# Patient Record
Sex: Male | Born: 1990 | Race: White | Hispanic: No | Marital: Single | Smoking: Former smoker
Health system: Southern US, Community
[De-identification: ages and names within clinical notes are randomized; demographics above are authoritative.]

---
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
## 2010-01-08 ENCOUNTER — Ambulatory Visit (HOSPITAL_COMMUNITY): Admission: RE | Admit: 2010-01-08 | Discharge: 2010-01-08 | Payer: Self-pay | Admitting: Preventative Medicine

## 2010-06-01 LAB — BASIC METABOLIC PANEL
BUN: 12 mg/dL (ref 6–23)
CO2: 30 mEq/L (ref 19–32)
Calcium: 9.4 mg/dL (ref 8.4–10.5)
Chloride: 105 mEq/L (ref 96–112)
Creatinine, Ser: 1.32 mg/dL (ref 0.4–1.5)
GFR calc Af Amer: >60 mL/min (ref >60)
GFR calc non Af Amer: >60 mL/min (ref >60)
Glucose, Bld: 90 mg/dL (ref 70–99)
Potassium: 3.7 mEq/L (ref 3.5–5.1)
Sodium: 138 mEq/L (ref 135–145)

## 2011-05-03 ENCOUNTER — Emergency Department (HOSPITAL_COMMUNITY)
Admission: EM | Admit: 2011-05-03 | Discharge: 2011-05-03 | Disposition: A | Discharge status: Home Health | Payer: Self-pay | Attending: Emergency Medicine | Admitting: Emergency Medicine

## 2011-05-03 ENCOUNTER — Emergency Department (HOSPITAL_COMMUNITY): Payer: Self-pay

## 2011-05-03 ENCOUNTER — Encounter (HOSPITAL_COMMUNITY): Payer: Self-pay | Admitting: Emergency Medicine

## 2011-05-03 DIAGNOSIS — Z79899 Other long term (current) drug therapy: Secondary | ICD-10-CM | POA: Insufficient documentation

## 2011-05-03 DIAGNOSIS — R209 Unspecified disturbances of skin sensation: Secondary | ICD-10-CM | POA: Insufficient documentation

## 2011-05-03 DIAGNOSIS — M79609 Pain in unspecified limb: Secondary | ICD-10-CM | POA: Insufficient documentation

## 2011-05-03 DIAGNOSIS — M25579 Pain in unspecified ankle and joints of unspecified foot: Secondary | ICD-10-CM | POA: Insufficient documentation

## 2011-05-03 DIAGNOSIS — M7989 Other specified soft tissue disorders: Secondary | ICD-10-CM | POA: Insufficient documentation

## 2011-05-03 DIAGNOSIS — M25473 Effusion, unspecified ankle: Secondary | ICD-10-CM | POA: Insufficient documentation

## 2011-05-03 DIAGNOSIS — W11XXXA Fall on and from ladder, initial encounter: Secondary | ICD-10-CM | POA: Insufficient documentation

## 2011-05-03 DIAGNOSIS — R269 Unspecified abnormalities of gait and mobility: Secondary | ICD-10-CM | POA: Insufficient documentation

## 2011-05-03 DIAGNOSIS — S93609A Unspecified sprain of unspecified foot, initial encounter: Secondary | ICD-10-CM | POA: Insufficient documentation

## 2011-05-03 DIAGNOSIS — M25476 Effusion, unspecified foot: Secondary | ICD-10-CM | POA: Insufficient documentation

## 2011-05-03 MED ORDER — HYDROMORPHONE HCL PF 1 MG/ML IJ SOLN
1.0000 mg | Freq: Once | INTRAMUSCULAR | Status: AC
  Administered 2011-05-03: 1 mg via INTRAMUSCULAR
  Filled 2011-05-03: qty 1

## 2011-05-03 MED ORDER — HYDROMORPHONE HCL PF 1 MG/ML IJ SOLN
1.0000 mg | INTRAMUSCULAR | Status: AC
  Administered 2011-05-03: 1 mg via INTRAMUSCULAR
  Filled 2011-05-03: qty 1

## 2011-05-03 MED ORDER — NAPROXEN 500 MG PO TABS: 500.0000 mg | ORAL_TABLET | Freq: Two times a day (BID) | ORAL | Status: AC

## 2011-05-03 MED ORDER — HYDROCODONE-ACETAMINOPHEN 5-500 MG PO TABS: 1.0000 | ORAL_TABLET | Freq: Four times a day (QID) | ORAL | Status: AC | PRN

## 2011-05-03 NOTE — Discharge Instructions (Signed)
Foot sprain  Your caregiver has diagnosed you as suffering from an foot sprain. Ankle and foot sprain occurs when the ligaments that hold the ankle joint to get her are stretched or torn. It may take 4-6 weeks to heal.  For activity: Use crutches with nonweightbearing for the first few days. Then, you may walk on your ankles as the pain allows, or as instructed. Start gradually with weight bearing on the affected ankle. Once you can walk pain free, then try jogging. When you can run forwards, then you can try moving side to side. If you cannot walk without crutches in one week, you need a recheck by your Family Doctor.  Seek immediate medical attention if: You're toes are numb or tingling, appear gray or blue, or you have severe pain (also elevate the leg and loosen the splint if this occurs)  If you do not have a family doctor to followup with, you can see the list of phone numbers below. Please call today to make a followup appointment.   RICE therapy:  Routine Care for injuries  Rest, Ice, Compression, Elevation (RICE)  Rest is needed to allow your body to heal. Routine activities can be resumed when comfortable. Injury tendons and bones can take up to 6 weeks to heal. Tendons are cordlike structures that attach muscles and bones.  Ice following an injury helps keep the swelling down and reduce the pain. Put ice in a plastic bag. Place a towel between your skin and the bag of ice. Leave the ice on for 15-20 minutes, 3-4 times a day. Do this while awake, for the first 24-48 hours. After that continue as directed by your caregiver.  Compression helps keep swelling down. It also gives support and helps with discomfort. If any lasting bandage has been applied, it should be removed and reapplied every 3-4 hours. It should not be applied tightly, but firmly enough to keep swelling down. Watch fingers or toes for swelling, discoloration, coldness, numbness or excessive pain. If any of these problems  occur, removed the bandage and reapply loosely. Contact your caregiver if these problems continue.  Elevation helps reduce swelling and decrease your pain. With extremities such as the arms, hands, legs and feet, the injured area should be placed near or above the level of the heart if possible.  Seek immediate medical care if you have persistent pain and swelling, developed redness numbness or unexpected weakness, your symptoms are getting worse rather than improving after several days. The symptoms may indicate that further evaluation or further x-rays are needed. Sometimes, x-rays may not show a small broken bone until one week or 10 days later. Make a followup appointment with your caregiver. Ask when your x-ray results will be ready. Make sure you get your x-ray results  RESOURCE GUIDE  Dental Problems  Patients with Medicaid: [facility] [phone]                                           [phone] [address] Phone:  [phone]  Phone:  510-2600 ° °If unable to pay or uninsured, contact:  [facility] or [facility] to become qualified for the adult dental clinic. ° °Chronic Pain Problems °Contact [facility]  297-2271 °Patients need to be referred by their primary care doctor. ° °Insufficient Money for Medicine °Contact [employer]:  call "211" or [facility] 271-5999. ° °No Primary Care Doctor °Call [facility]  832-8000 °Other agencies that provide inexpensive medical care °   [facility]  832-8035 °    Internal Medicine  832-7272 °   [facility]  271-5999 °   Women's Clinic  832-4777 °   [facility]  373-0678 °   [facility]  272-1050 ° °Psychological Services °[facility]  832-9600 °[facility]  378-7881 °[facility]   800 853-5163 (emergency services  641-4993) ° °Substance Abuse Resources °[facility]  336-882-2125 °[facility] 336-784-9470 °[facility] 336-285-9073 °[facility] 336-845-3988 °Residential & Outpatient Substance Abuse Program  800-659-3381 ° °Abuse/Neglect °[place] [facility] (336) 641-3795 °[place] [facility] 800-378-5315 (After Hours) ° °Emergency Shelter °[facility] (336) 271-5985 ° °Maternity Homes °[facility] (336) 275-9566 °[facility] (704) 372-4663 ° °MRSA Hotline #:   832-7006 ° ° ° °[place] [facility] ° °[facility] [facility]     [employer]                          [facility] °[address] [place]                       [address]      [address]  °[place]                                                [place]                            [place] °Phone:  349-3220                                   Phone:  342-7768                 Phone:  342-8140 ° °[facility] °Phone:  342-8316 ° °[place] [facility] Hotline °(336) 342-1394 °(336) 342-3537 (After Hours) ° ° ° ° °

## 2011-05-03 NOTE — ED Notes (Signed)
Patient returned from CT scan via wheelchair

## 2011-05-03 NOTE — ED Notes (Signed)
Hydromorphone 1mg  IM left deltoid - in CT at this time.  Will scan/document on MAR when patient returns.(No scanner available in CT room.)

## 2011-05-03 NOTE — ED Provider Notes (Signed)
History     CSN: [id number]  Arrival date & time 05/03/11  0112   First MD Initiated Contact with Patient 05/03/11 0145      Chief Complaint  Patient presents with  . Ankle Pain    (Consider location/radiation/quality/duration/timing/severity/associated sxs/prior treatment) HPI Comments: Chief Complaint: Extremity injury  Chad Taylor 22 y.o. male   patient and parent  Mechanism of injury fall from tree - 10 feet up in ladder, Location right foot/feet  Onset:  sudden,   Course worsened,  Timing: persistent,   Severity severe  Alleviating factors rest,    Aggravating factors, walking, standing  Treatment prior to arrival none  Ambulation: can't walk since 10 minutes after fall 2/2 pain  no surgeries  Additional History: none  = occurred at 6 PM   Patient is a 21 y.o. male presenting with ankle pain. The history is provided by the patient and a relative.  Ankle Pain  Pertinent negatives include no numbness.    History reviewed. No pertinent past medical history.  History reviewed. No pertinent past surgical history.  No family history on file.  History  Substance Use Topics  . Smoking status: Former Games developer  . Smokeless tobacco: Not on file  . Alcohol Use: Yes      Review of Systems  Constitutional: Negative for fever and chills.  HENT: Negative for neck pain.   Eyes: Negative for visual disturbance.  Respiratory: Negative for shortness of breath.   Cardiovascular: Positive for leg swelling.  Gastrointestinal: Negative for nausea and vomiting.  Musculoskeletal: Positive for joint swelling and gait problem. Negative for back pain.  Skin: Negative for rash.  Neurological: Negative for weakness, numbness and headaches.  Hematological: Does not bruise/bleed easily.    Allergies  Review of patient's allergies indicates no known allergies.  Home Medications   Current Outpatient Rx  Name Route Sig Dispense Refill  .  HYDROCODONE-ACETAMINOPHEN 5-500 MG PO TABS Oral Take 1-2 tablets by mouth every 6 (six) hours as needed for pain. 15 tablet 0  . NAPROXEN 500 MG PO TABS Oral Take 1 tablet (500 mg total) by mouth 2 (two) times daily with a meal. 30 tablet 0    BP 134/99  Pulse 94  Temp(Src) 98 F (36.7 C) (Oral)  Resp 20  Ht 6' (1.829 m)  Wt 245 lb (111.131 kg)  BMI 33.23 kg/m2  SpO2 97%  Physical Exam  Nursing note and vitals reviewed. Constitutional: He appears well-developed and well-nourished. He appears distressed.  HENT:  Head: Normocephalic and atraumatic.  Mouth/Throat: Oropharynx is clear and moist. No oropharyngeal exudate.  Eyes: Conjunctivae and EOM are normal. Pupils are equal, round, and reactive to light. Right eye exhibits no discharge. Left eye exhibits no discharge. No scleral icterus.  Neck: Normal range of motion. Neck supple. No JVD present. No thyromegaly present.  Cardiovascular: Normal rate, regular rhythm, normal heart sounds and intact distal pulses.  Exam reveals no gallop and no friction rub.   No murmur heard. Pulmonary/Chest: Effort normal and breath sounds normal. No respiratory distress. He has no wheezes. He has no rales.  Abdominal: Soft. Bowel sounds are normal. He exhibits no distension and no mass. There is no tenderness.  Musculoskeletal: Normal range of motion. He exhibits tenderness ( R foot with severe ttp on top of foot - no ttp over the [facility].  dec ROM second to pain, moderate swelling to mid foot. - normal pulses). He exhibits no edema.  Lymphadenopathy:    He  has no cervical adenopathy.  Neurological: He is alert. Coordination normal.  Skin: Skin is warm and dry. No rash noted. No erythema.  Psychiatric: He has a normal mood and affect. His behavior is normal.    ED Course  Procedures (including critical care time)  Labs Reviewed - No data to display Dg Ankle Complete Right  05/03/2011  *RADIOLOGY REPORT*  Clinical Data:  Right ankle pain, fell off  ladder hyperflexing and twisting right foot/ankle  RIGHT ANKLE - COMPLETE 3+ VIEW  Comparison: None.  Findings: Ankle mortise intact. Osseous mineralization normal. Minimal scattered soft tissue swelling. No acute fracture, dislocation, or bone destruction.  IMPRESSION: No acute osseous abnormalities.  Original Report Authenticated By: Lollie Marrow, M.D.   Ct Foot Right Wo Contrast  05/03/2011  *RADIOLOGY REPORT*  Clinical Data:  Larey Seat off ladder, pain and swelling, negative radiographs  CT OF THE RIGHT FOOT WITHOUT CONTRAST  Technique:  Multidetector CT imaging was performed according to the standard protocol. Multiplanar CT image reconstructions were also generated.  Comparison: Radiographs 05/03/2011  Findings: Osseous mineralization normal. Joint spaces preserved. No acute fracture or dislocation. No bone destruction. No significant regional soft tissue abnormalities, abnormal fluid collections or hematoma identified.  IMPRESSION: No acute osseous abnormalities.  Original Report Authenticated By: Lollie Marrow, M.D.     1. Sprain of foot       MDM  ttp over mid foot - imaging is neg for frx - I have personally seen xray and see no frx of ankle - proceed with CT of foot - pain meds.  Ice and elevate.  Initial imaging of the foot showed no signs of fracture of the ankle or foot. CT scan obtained due to the significant pain he continues to experience. CT scan was read as negative, no signs of fracture or dislocation. I have placed an Ace wrap, ice pack, 2 doses of intramuscular hydromorphone and was sent home with orthopedic followup when necessary. Crutches given prior to discharge  Discharge Prescriptions include:  #1 Naprosyn,  #2 hydrocodone       Vida Roller, MD 05/03/11 [phone]

## 2011-05-03 NOTE — ED Notes (Signed)
Patient states was cutting trees and a branch was falling toward him; states he jumped off ladder and landed on roots and rolled his right ankle.  States pain woke him from sleep.

## 2011-05-03 NOTE — ED Notes (Signed)
Patient remains in CT scan.  Mother informed that patient remains in CT.

## 2015-10-16 ENCOUNTER — Emergency Department (HOSPITAL_COMMUNITY): Payer: Managed Care, Other (non HMO)

## 2015-10-16 ENCOUNTER — Encounter (HOSPITAL_COMMUNITY): Payer: Self-pay | Admitting: Emergency Medicine

## 2015-10-16 ENCOUNTER — Emergency Department (HOSPITAL_COMMUNITY)
Admission: EM | Admit: 2015-10-16 | Discharge: 2015-10-16 | Disposition: A | Discharge status: Home / Self Care | Payer: Managed Care, Other (non HMO) | Attending: Emergency Medicine | Admitting: Emergency Medicine

## 2015-10-16 DIAGNOSIS — Y92410 Unspecified street and highway as the place of occurrence of the external cause: Secondary | ICD-10-CM | POA: Insufficient documentation

## 2015-10-16 DIAGNOSIS — S60511A Abrasion of right hand, initial encounter: Secondary | ICD-10-CM | POA: Insufficient documentation

## 2015-10-16 DIAGNOSIS — Y999 Unspecified external cause status: Secondary | ICD-10-CM | POA: Diagnosis not present

## 2015-10-16 DIAGNOSIS — M25572 Pain in left ankle and joints of left foot: Secondary | ICD-10-CM | POA: Diagnosis not present

## 2015-10-16 DIAGNOSIS — S60512A Abrasion of left hand, initial encounter: Secondary | ICD-10-CM | POA: Diagnosis not present

## 2015-10-16 DIAGNOSIS — M542 Cervicalgia: Secondary | ICD-10-CM | POA: Diagnosis not present

## 2015-10-16 DIAGNOSIS — Y939 Activity, unspecified: Secondary | ICD-10-CM | POA: Diagnosis not present

## 2015-10-16 DIAGNOSIS — R51 Headache: Secondary | ICD-10-CM | POA: Diagnosis not present

## 2015-10-16 DIAGNOSIS — W19XXXA Unspecified fall, initial encounter: Secondary | ICD-10-CM

## 2015-10-16 DIAGNOSIS — S6991XA Unspecified injury of right wrist, hand and finger(s), initial encounter: Secondary | ICD-10-CM | POA: Diagnosis present

## 2015-10-16 DIAGNOSIS — Z87891 Personal history of nicotine dependence: Secondary | ICD-10-CM | POA: Diagnosis not present

## 2015-10-16 LAB — I-STAT CHEM 8, ED
BUN: 11 mg/dL (ref 6–20)
CREATININE: 1.2 mg/dL (ref 0.61–1.24)
Calcium, Ion: 1.23 mmol/L (ref 1.13–1.30)
Chloride: 103 mmol/L (ref 101–111)
GLUCOSE: 89 mg/dL (ref 65–99)
HEMATOCRIT: 42 % (ref 39.0–52.0)
HEMOGLOBIN: 14.3 g/dL (ref 13.0–17.0)
POTASSIUM: 3.8 mmol/L (ref 3.5–5.1)
Sodium: 144 mmol/L (ref 135–145)
TCO2: 27 mmol/L (ref 0–100)

## 2015-10-16 MED ORDER — HYDROCODONE-ACETAMINOPHEN 5-325 MG PO TABS
1.0000 | ORAL_TABLET | Freq: Once | ORAL | Status: AC
  Administered 2015-10-16: 1 via ORAL
  Filled 2015-10-16: qty 1

## 2015-10-16 MED ORDER — TRAMADOL HCL 50 MG PO TABS: 50.0000 mg | ORAL_TABLET | Freq: Four times a day (QID) | ORAL | 0 refills | Status: DC | PRN

## 2015-10-16 MED ORDER — IOPAMIDOL (ISOVUE-300) INJECTION 61%
100.0000 mL | Freq: Once | INTRAVENOUS | Status: AC | PRN
  Administered 2015-10-16: 100 mL via INTRAVENOUS

## 2015-10-16 MED ORDER — SODIUM CHLORIDE 0.9 % IV BOLUS (SEPSIS)
500.0000 mL | Freq: Once | INTRAVENOUS | Status: AC
  Administered 2015-10-16: 500 mL via INTRAVENOUS

## 2015-10-16 NOTE — Discharge Instructions (Signed)
[place] he can follow-up with Dr. Fuller [place]ley Harrison [place]. She used to be seen the end of this week or next week. Keep the foot elevated
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
## 2015-10-18 ENCOUNTER — Telehealth: Payer: Self-pay | Admitting: Orthopedic Surgery
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
## 2015-11-01 NOTE — Telephone Encounter (Signed)
Have been unable to reach patient.  No further response or call from patient.

## 2015-11-22 ENCOUNTER — Encounter (HOSPITAL_COMMUNITY): Payer: Self-pay

## 2015-11-22 ENCOUNTER — Emergency Department (HOSPITAL_COMMUNITY)
Admission: EM | Admit: 2015-11-22 | Discharge: 2015-11-22 | Disposition: A | Discharge status: Home / Self Care | Payer: Managed Care, Other (non HMO) | Attending: Emergency Medicine | Admitting: Emergency Medicine

## 2015-11-22 ENCOUNTER — Emergency Department (HOSPITAL_COMMUNITY): Payer: Managed Care, Other (non HMO)

## 2015-11-22 DIAGNOSIS — Y999 Unspecified external cause status: Secondary | ICD-10-CM | POA: Insufficient documentation

## 2015-11-22 DIAGNOSIS — S39012A Strain of muscle, fascia and tendon of lower back, initial encounter: Secondary | ICD-10-CM | POA: Insufficient documentation

## 2015-11-22 DIAGNOSIS — Y929 Unspecified place or not applicable: Secondary | ICD-10-CM | POA: Insufficient documentation

## 2015-11-22 DIAGNOSIS — Y939 Activity, unspecified: Secondary | ICD-10-CM | POA: Insufficient documentation

## 2015-11-22 DIAGNOSIS — X58XXXA Exposure to other specified factors, initial encounter: Secondary | ICD-10-CM | POA: Insufficient documentation

## 2015-11-22 DIAGNOSIS — Z87891 Personal history of nicotine dependence: Secondary | ICD-10-CM | POA: Insufficient documentation

## 2015-11-22 DIAGNOSIS — M549 Dorsalgia, unspecified: Secondary | ICD-10-CM | POA: Diagnosis present

## 2015-11-22 MED ORDER — ACETAMINOPHEN 325 MG PO TABS
650.0000 mg | ORAL_TABLET | Freq: Once | ORAL | Status: AC
  Administered 2015-11-22: 650 mg via ORAL
  Filled 2015-11-22: qty 2

## 2015-11-22 MED ORDER — IBUPROFEN 600 MG PO TABS: ORAL_TABLET | ORAL | 0 refills | Status: DC

## 2015-11-22 MED ORDER — DIAZEPAM 5 MG PO TABS
10.0000 mg | ORAL_TABLET | Freq: Once | ORAL | Status: AC
  Administered 2015-11-22: 10 mg via ORAL
  Filled 2015-11-22: qty 2

## 2015-11-22 MED ORDER — IBUPROFEN 800 MG PO TABS
800.0000 mg | ORAL_TABLET | Freq: Once | ORAL | Status: AC
  Administered 2015-11-22: 800 mg via ORAL
  Filled 2015-11-22: qty 1

## 2015-11-22 MED ORDER — CYCLOBENZAPRINE HCL 10 MG PO TABS: 10.0000 mg | ORAL_TABLET | Freq: Three times a day (TID) | ORAL | 0 refills | Status: DC
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
## 2015-11-22 NOTE — Discharge Instructions (Signed)
Your x-ray tonight is negative for fracture, dislocation, or other acute issue. I reviewed the CT scan that was done in August. Please use a heating pad to your back. Please rest your back is much as possible. Use Flexeril 3 times daily for spasm pain, use ibuprofen and 500 mg of Tylenol with each meal and at bedtime. Please see Dr. Romeo AppleHarrison for additional orthopedic evaluation concerning your back, as well as any accommodations that need to be made at your job.
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
## 2015-11-27 ENCOUNTER — Telehealth: Payer: Self-pay | Admitting: Orthopedic Surgery

## 2015-11-27 NOTE — Telephone Encounter (Signed)
This patient is asking to be seen for lower back pain. Please review his er notes on 8/21 & 9/27. He was referred to you through the ER for backpain.  Please advise

## 2015-11-27 NOTE — Telephone Encounter (Signed)
I reviewed his x-rays there is no fracture  Follow-up can be in 6 weeks with us

## 2015-11-28 ENCOUNTER — Telehealth: Payer: Self-pay | Admitting: Orthopedic Surgery

## 2015-11-28 ENCOUNTER — Encounter (HOSPITAL_COMMUNITY): Payer: Self-pay | Admitting: Emergency Medicine

## 2015-11-28 ENCOUNTER — Emergency Department (HOSPITAL_COMMUNITY)
Admission: EM | Admit: 2015-11-28 | Discharge: 2015-11-28 | Disposition: A | Discharge status: Home / Self Care | Payer: Managed Care, Other (non HMO) | Attending: Emergency Medicine | Admitting: Emergency Medicine

## 2015-11-28 DIAGNOSIS — Y92410 Unspecified street and highway as the place of occurrence of the external cause: Secondary | ICD-10-CM | POA: Insufficient documentation

## 2015-11-28 DIAGNOSIS — Y9389 Activity, other specified: Secondary | ICD-10-CM | POA: Insufficient documentation

## 2015-11-28 DIAGNOSIS — M545 Low back pain, unspecified: Secondary | ICD-10-CM

## 2015-11-28 DIAGNOSIS — Y999 Unspecified external cause status: Secondary | ICD-10-CM | POA: Insufficient documentation

## 2015-11-28 DIAGNOSIS — Z87891 Personal history of nicotine dependence: Secondary | ICD-10-CM | POA: Diagnosis not present

## 2015-11-28 DIAGNOSIS — Z791 Long term (current) use of non-steroidal anti-inflammatories (NSAID): Secondary | ICD-10-CM | POA: Insufficient documentation

## 2015-11-28 NOTE — ED Notes (Signed)
Patient given discharge instruction, verbalized understand. Patient ambulatory out of the department.  

## 2015-11-28 NOTE — ED Triage Notes (Signed)
Pt seen here 6 days ago for back pain. Pt was referred to Dr. Romeo AppleHarrison. Pt reports he is unable to get an appt for several weeks. Pt states his pain is no better, has been out of work. Per pt was told to come back if he could not get in to see Ortho within a week or 2.
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
## 2015-11-28 NOTE — ED Notes (Signed)
Pt states he can not work in this much pain. Had tried muscle relaxer before without relief. Dr Romeo AppleHarrison can not see him for 6 weeks. He does not know what to do.

## 2015-11-28 NOTE — Telephone Encounter (Signed)
Called and spoke with Madelin Rearillon to offer an appointment to see Dr. Romeo AppleHarrison. Per Dr. Romeo AppleHarrison he would be a follow up in 6 weeks. That would be in November and Madelin RearDillon said he would find someone else before then and refused the appointment. He was very nice.

## 2015-12-01 ENCOUNTER — Ambulatory Visit (HOSPITAL_COMMUNITY)
Admission: RE | Admit: 2015-12-01 | Discharge: 2015-12-01 | Disposition: A | Discharge status: Home / Self Care | Payer: Managed Care, Other (non HMO) | Source: Ambulatory Visit | Attending: Specialist | Admitting: Specialist

## 2015-12-01 ENCOUNTER — Other Ambulatory Visit (HOSPITAL_COMMUNITY): Payer: Self-pay | Admitting: Specialist

## 2015-12-01 DIAGNOSIS — Z01818 Encounter for other preprocedural examination: Secondary | ICD-10-CM

## 2016-06-10 ENCOUNTER — Emergency Department (HOSPITAL_COMMUNITY)
Admission: EM | Admit: 2016-06-10 | Discharge: 2016-06-11 | Disposition: A | Discharge status: Home / Self Care | Payer: Self-pay | Attending: Emergency Medicine | Admitting: Emergency Medicine

## 2016-06-10 ENCOUNTER — Encounter (HOSPITAL_COMMUNITY): Payer: Self-pay | Admitting: *Deleted

## 2016-06-10 DIAGNOSIS — F1510 Other stimulant abuse, uncomplicated: Secondary | ICD-10-CM | POA: Insufficient documentation

## 2016-06-10 DIAGNOSIS — Z87891 Personal history of nicotine dependence: Secondary | ICD-10-CM | POA: Insufficient documentation

## 2016-06-10 DIAGNOSIS — I201 Angina pectoris with documented spasm: Secondary | ICD-10-CM

## 2016-06-10 DIAGNOSIS — Z79899 Other long term (current) drug therapy: Secondary | ICD-10-CM | POA: Insufficient documentation

## 2016-06-10 DIAGNOSIS — R079 Chest pain, unspecified: Secondary | ICD-10-CM

## 2016-06-10 DIAGNOSIS — Z791 Long term (current) use of non-steroidal anti-inflammatories (NSAID): Secondary | ICD-10-CM | POA: Insufficient documentation

## 2016-06-10 DIAGNOSIS — I739 Peripheral vascular disease, unspecified: Secondary | ICD-10-CM | POA: Insufficient documentation

## 2016-06-10 LAB — CBC WITH DIFFERENTIAL/PLATELET
BASOS ABS: 0 10*3/uL (ref 0.0–0.1)
Basophils Relative: 0 %
Eosinophils Absolute: 0 10*3/uL (ref 0.0–0.7)
Eosinophils Relative: 1 %
HCT: 38.9 % — ABNORMAL LOW (ref 39.0–52.0)
HEMOGLOBIN: 13.4 g/dL (ref 13.0–17.0)
LYMPHS ABS: 2.2 10*3/uL (ref 0.7–4.0)
Lymphocytes Relative: 41 %
MCH: 30 pg (ref 26.0–34.0)
MCHC: 34.4 g/dL (ref 30.0–36.0)
MCV: 87 fL (ref 78.0–100.0)
Monocytes Absolute: 0.7 10*3/uL (ref 0.1–1.0)
Monocytes Relative: 12 %
NEUTROS PCT: 46 %
Neutro Abs: 2.5 10*3/uL (ref 1.7–7.7)
Platelets: 233 10*3/uL (ref 150–400)
RBC: 4.47 MIL/uL (ref 4.22–5.81)
RDW: 12 % (ref 11.5–15.5)
WBC: 5.4 10*3/uL (ref 4.0–10.5)

## 2016-06-10 NOTE — ED Triage Notes (Signed)
Pt reports having chest pressure, tightness, palpitations, and sob x 1hr ago. Pt reports urinated once today and has not had a BM in several days.

## 2016-06-10 NOTE — ED Provider Notes (Addendum)
[facility] Provider Note   CSN: [id number] Arrival date & time: 06/10/16  2252 By signing my name below, I, Chad Taylor, attest that this documentation has been prepared under the direction and in the presence of Derwood Kaplan, MD . Electronically Signed: Levon Taylor, Scribe. 06/10/2016. 11:53 PM.   History   Chief Complaint Chief Complaint  Patient presents with  . Chest Pain   HPI Chad Taylor is an otherwise healthy 26 y.o. male who presents to the Emergency Department complaining of sudden onset, constant, pressure-like chest pain with radiation to his neck and jaw onset one hour ago. He notes associated chest tightness, leg pain, SOB, tremors, and dizziness. Pt describes "twitching, muscle spasms" to his bilateral legs which occur when his pain becomes severe. Per pt, these symptoms began tonight while at rest. No alleviating or modifying factors noted.  No OTC treatments tried for these symptoms PTA. Pt also expresses that he has unintentionally lost 40 pounds in the last 3 months. He is currently employed [employer] on [employer], and states he recently returned back to work from disability. He denies any alcohol use, regular tobacco use, or illicit drug use. No family hx of sudden cardiac death. No hx of musculoskeletal disorder for self or family. No recent illnesses. Pt denies any nausea, vomiting, or diaphoresis.   The history is provided by the patient. No language interpreter was used.   History reviewed. No pertinent past medical history.  There are no active problems to display for this patient.   History reviewed. No pertinent surgical history.   Home Medications    Prior to Admission medications   Medication Sig Start Date End Date Taking? Authorizing Provider  cyclobenzaprine (FLEXERIL) 10 MG tablet Take 1 tablet (10 mg total) by mouth 3 (three) times daily. 11/22/15   Ivery Quale, PA-C  ibuprofen (ADVIL,MOTRIN) 600 MG tablet 1 po  with each meal and at bedtime (4 times a day) 11/22/15   Ivery Quale, PA-C  traMADol (ULTRAM) 50 MG tablet Take 1 tablet (50 mg total) by mouth every 6 (six) hours as needed. 10/16/15   Bethann Berkshire, MD   Family History History reviewed. No pertinent family history.  Social History Social History  Substance Use Topics  . Smoking status: Former Games developer  . Smokeless tobacco: Never Used  . Alcohol use Yes     Comment: occ    Allergies   Patient has no known allergies.  Review of Systems Review of Systems  Constitutional: Negative for diaphoresis.  Respiratory: Positive for chest tightness and shortness of breath.   Cardiovascular: Positive for chest pain.  Gastrointestinal: Negative for nausea and vomiting.  Musculoskeletal: Positive for myalgias.  Neurological: Positive for dizziness, tremors and light-headedness.  All other systems reviewed and are negative.  Physical Exam Updated Vital Signs BP (!) 100/57 (BP Location: Left Arm)   Pulse 87   Temp 98 F (36.7 C) (Oral)   Resp 18   Ht 6' (1.829 m)   Wt 253 lb (114.8 kg)   SpO2 100%   BMI 34.31 kg/m   Physical Exam  Constitutional: He is oriented to person, place, and time. He appears well-developed and well-nourished. No distress.  HENT:  Head: Normocephalic and atraumatic.  Eyes: Conjunctivae are normal.  Neck:  No nodules palpated over thyroid   Cardiovascular: Normal rate and regular rhythm.   No murmur heard. 2+ equal radial pulses bilaterally  Pulmonary/Chest: Effort normal.  Rhonchi on the left side, otherwise lungs  CTA.  Abdominal: Soft. He exhibits no distension and no mass. There is no tenderness.  Musculoskeletal: He exhibits no edema.  No pitting edema  Neurological: He is alert and oriented to person, place, and time.  Skin: Skin is warm and dry.  Psychiatric: He has a normal mood and affect.  Nursing note and vitals reviewed.   ED Treatments / Results  DIAGNOSTIC STUDIES:  Oxygen Saturation  is 95% on RA, normal by my interpretation.    COORDINATION OF CARE:  11:48 PM Discussed treatment plan with pt at bedside and pt agreed to plan.   Labs (all labs ordered are listed, but only abnormal results are displayed) Labs Reviewed  CBC WITH DIFFERENTIAL/PLATELET - Abnormal; Notable for the following:       Result Value   HCT 38.9 (*)    All other components within normal limits  COMPREHENSIVE METABOLIC PANEL - Abnormal; Notable for the following:    Sodium 132 (*)    Chloride 99 (*)    Glucose, Bld 131 (*)    Calcium 8.7 (*)    All other components within normal limits  RAPID URINE DRUG SCREEN, HOSP PERFORMED - Abnormal; Notable for the following:    Cocaine POSITIVE (*)    Amphetamines POSITIVE (*)    Tetrahydrocannabinol POSITIVE (*)    All other components within normal limits  TROPONIN I  D-DIMER, QUANTITATIVE (NOT AT [facility])  TROPONIN I  TSH    EKG  EKG Interpretation  Date/Time:  Monday June 10 2016 23:15:56 EDT Ventricular Rate:  87 PR Interval:  128 QRS Duration: 97 QT Interval:  356 QTC Calculation: 429 R Axis:   -4 Text Interpretation:  Sinus rhythm Borderline short PR interval RSR prime in III [id number] possible No old tracing to compare Confirmed by Rhunette Croft, MD, Janey Genta [phone]) on 06/10/2016 11:52:58 PM       Radiology Dg Chest 2 View  Result Date: 06/11/2016 CLINICAL DATA:  Initial evaluation for left-sided rhonchi. EXAM: CHEST  2 VIEW COMPARISON:  Prior CT from 10/16/2015. FINDINGS: The cardiac and mediastinal silhouettes are stable in size and contour, and remain within normal limits. The lungs are normally inflated. No airspace consolidation, pleural effusion, or pulmonary edema is identified. There is no pneumothorax. No acute osseous abnormality identified. IMPRESSION: No radiographic evidence for active cardiopulmonary disease. Electronically Signed   By: Rise Mu M.D.   On: 06/11/2016 00:23    Procedures Procedures (including  critical care time)  Medications Ordered in ED Medications  aspirin chewable tablet 324 mg (324 mg Oral Given 06/11/16 0134)     Initial Impression / Assessment and Plan / ED Course  I have reviewed the triage vital signs and the nursing notes.  Pertinent labs & imaging results that were available during my care of the patient were reviewed by me and considered in my medical decision making (see chart for details).  Clinical Course as of Jun 11 401  Tue Jun 11, 2016  0401 Results from the ER workup discussed with the patient face to face and all questions answered to the best of my ability.  Pt still denies substance abuse. UDS results discussed. No chest pain at the moment. Trops x 2 and cardiac monitoring > 5 hours have been normal. We will d.c. Strict ER return precautions have been discussed, and patient is agreeing with the plan and is comfortable with the workup done and the recommendations from the ER.    [AN]    Clinical Course User  Index [AN] Derwood Kaplan, MD    Pt comes in with cc of chest pain.  Differential diagnosis includes: ACS syndrome Aortic dissection CHF exacerbation Valvular disorder Myocarditis Pericarditis Endocarditis Pericardial effusion / tamponade Pneumonia Pleural effusion / Pulmonary edema PE Pneumothorax Musculoskeletal pain PUD / Gastritis / Esophagitis Esophageal spasm   Chest pain has some typical features of ACS, however, outside of smoking and being overweight, there are no other risk factors. HEAR score is 2 (1 for hx and 1 for risk factor). We will start with trop x 2. Pt has no active chest pain.   Pain is not pleuritic or reproducible. PE is low in the ddx.  Pt has no dissection risk factors. Pulse bilaterally are equal over the radial A.  I am not sure what to make of the shaking and the weight loss at this time. TSH added. BMP ordered.   Final Clinical Impressions(s) / ED Diagnoses   Final diagnoses:  Stimulant  abuse  Chest pain, unspecified type  Coronary vasospasm (HCC)    New Prescriptions New Prescriptions   No medications on file  I personally performed the services described in this documentation, which was scribed in my presence. The recorded information has been reviewed and is accurate.    Derwood Kaplan, MD 06/11/16 1610    Derwood Kaplan, MD 06/11/16 9604

## 2016-06-11 ENCOUNTER — Emergency Department (HOSPITAL_COMMUNITY): Payer: Self-pay

## 2016-06-11 LAB — COMPREHENSIVE METABOLIC PANEL
ALBUMIN: 4.2 g/dL (ref 3.5–5.0)
ALK PHOS: 46 U/L (ref 38–126)
ALT: 21 U/L (ref 17–63)
AST: 20 U/L (ref 15–41)
Anion gap: 7 (ref 5–15)
BILIRUBIN TOTAL: 0.5 mg/dL (ref 0.3–1.2)
BUN: 15 mg/dL (ref 6–20)
CALCIUM: 8.7 mg/dL — AB (ref 8.9–10.3)
CO2: 26 mmol/L (ref 22–32)
CREATININE: 1.06 mg/dL (ref 0.61–1.24)
Chloride: 99 mmol/L — ABNORMAL LOW (ref 101–111)
GFR calc Af Amer: >60 mL/min (ref >60)
GLUCOSE: 131 mg/dL — AB (ref 65–99)
POTASSIUM: 3.7 mmol/L (ref 3.5–5.1)
Sodium: 132 mmol/L — ABNORMAL LOW (ref 135–145)
TOTAL PROTEIN: 6.7 g/dL (ref 6.5–8.1)

## 2016-06-11 LAB — RAPID URINE DRUG SCREEN, HOSP PERFORMED
AMPHETAMINES: POSITIVE — AB
BARBITURATES: NOT DETECTED
Benzodiazepines: NOT DETECTED
Cocaine: POSITIVE — AB
Opiates: NOT DETECTED
TETRAHYDROCANNABINOL: POSITIVE — AB

## 2016-06-11 LAB — TROPONIN I
Troponin I: <0.03 ng/mL (ref <0.03)
Troponin I: <0.03 ng/mL (ref <0.03)

## 2016-06-11 LAB — TSH: TSH: 1.047 u[IU]/mL (ref 0.350–4.500)

## 2016-06-11 LAB — D-DIMER, QUANTITATIVE (NOT AT ARMC)

## 2016-06-11 MED ORDER — ASPIRIN 81 MG PO CHEW
324.0000 mg | CHEWABLE_TABLET | Freq: Once | ORAL | Status: AC
  Administered 2016-06-11: 324 mg via ORAL
  Filled 2016-06-11: qty 4

## 2016-06-11 NOTE — ED Notes (Signed)
Patient asleep and snoring upon entering room. Had to shake patient to awaken him to obtain vitals.

## 2016-06-11 NOTE — Discharge Instructions (Signed)
Your drug screen is positive for cocaine and amphetamines. You had denied drug use to [place] - but if you are using them, please stop using drugs. Cocaine certainly will give you heart attack.  Please return to the ER if you have worsening chest pain, shortness of breath, pain radiating to your jaw, shoulder, or back, sweats or fainting.  See your doctor in 1 week.

## 2016-06-11 NOTE — ED Notes (Signed)
Pt is aware that we need a urine specimen

## 2016-06-30 ENCOUNTER — Emergency Department: Payer: Managed Care, Other (non HMO)

## 2016-06-30 ENCOUNTER — Emergency Department
Admission: EM | Admit: 2016-06-30 | Discharge: 2016-06-30 | Payer: Managed Care, Other (non HMO) | Attending: Emergency Medicine | Admitting: Emergency Medicine

## 2016-06-30 DIAGNOSIS — Y92513 Shop (commercial) as the place of occurrence of the external cause: Secondary | ICD-10-CM | POA: Insufficient documentation

## 2016-06-30 DIAGNOSIS — S82454B Nondisplaced comminuted fracture of shaft of right fibula, initial encounter for open fracture type I or II: Secondary | ICD-10-CM | POA: Insufficient documentation

## 2016-06-30 DIAGNOSIS — W3400XA Accidental discharge from unspecified firearms or gun, initial encounter: Secondary | ICD-10-CM | POA: Insufficient documentation

## 2016-06-30 DIAGNOSIS — Y939 Activity, unspecified: Secondary | ICD-10-CM | POA: Insufficient documentation

## 2016-06-30 DIAGNOSIS — Z87891 Personal history of nicotine dependence: Secondary | ICD-10-CM | POA: Insufficient documentation

## 2016-06-30 DIAGNOSIS — Y999 Unspecified external cause status: Secondary | ICD-10-CM | POA: Insufficient documentation

## 2016-06-30 LAB — COMPREHENSIVE METABOLIC PANEL
ALK PHOS: 44 U/L (ref 38–126)
ALT: 29 U/L (ref 17–63)
ANION GAP: 6 (ref 5–15)
AST: 21 U/L (ref 15–41)
Albumin: 4.7 g/dL (ref 3.5–5.0)
BUN: 10 mg/dL (ref 6–20)
CHLORIDE: 104 mmol/L (ref 101–111)
CO2: 26 mmol/L (ref 22–32)
CREATININE: 1.02 mg/dL (ref 0.61–1.24)
Calcium: 9 mg/dL (ref 8.9–10.3)
GFR calc non Af Amer: >60 mL/min (ref >60)
Glucose, Bld: 106 mg/dL — ABNORMAL HIGH (ref 65–99)
Potassium: 2.9 mmol/L — ABNORMAL LOW (ref 3.5–5.1)
SODIUM: 136 mmol/L (ref 135–145)
Total Bilirubin: 0.7 mg/dL (ref 0.3–1.2)
Total Protein: 7.8 g/dL (ref 6.5–8.1)

## 2016-06-30 LAB — CBC WITH DIFFERENTIAL/PLATELET
BASOS ABS: 0 10*3/uL (ref 0–0.1)
Basophils Relative: 0 %
EOS ABS: 0 10*3/uL (ref 0–0.7)
EOS PCT: 0 %
HCT: 42.5 % (ref 40.0–52.0)
HEMOGLOBIN: 14.5 g/dL (ref 13.0–18.0)
LYMPHS ABS: 1.5 10*3/uL (ref 1.0–3.6)
LYMPHS PCT: 19 %
MCH: 29.7 pg (ref 26.0–34.0)
MCHC: 34.2 g/dL (ref 32.0–36.0)
MCV: 86.7 fL (ref 80.0–100.0)
Monocytes Absolute: 0.5 10*3/uL (ref 0.2–1.0)
Monocytes Relative: 6 %
NEUTROS PCT: 75 %
Neutro Abs: 5.8 10*3/uL (ref 1.4–6.5)
PLATELETS: 295 10*3/uL (ref 150–440)
RBC: 4.9 MIL/uL (ref 4.40–5.90)
RDW: 12.4 % (ref 11.5–14.5)
WBC: 7.9 10*3/uL (ref 3.8–10.6)

## 2016-06-30 LAB — TYPE AND SCREEN
ABO/RH(D): O POS
Antibody Screen: NEGATIVE

## 2016-06-30 MED ORDER — MORPHINE SULFATE (PF) 4 MG/ML IV SOLN
INTRAVENOUS | Status: AC
  Filled 2016-06-30: qty 1

## 2016-06-30 MED ORDER — SODIUM CHLORIDE 0.9 % IV SOLN
3.0000 g | Freq: Once | INTRAVENOUS | Status: AC
  Administered 2016-06-30: 3 g via INTRAVENOUS
  Filled 2016-06-30: qty 3

## 2016-06-30 MED ORDER — TETANUS-DIPHTH-ACELL PERTUSSIS 5-2.5-18.5 LF-MCG/0.5 IM SUSP
0.5000 mL | Freq: Once | INTRAMUSCULAR | Status: AC
  Administered 2016-06-30: 0.5 mL via INTRAMUSCULAR
  Filled 2016-06-30: qty 0.5

## 2016-06-30 MED ORDER — MORPHINE SULFATE (PF) 4 MG/ML IV SOLN
4.0000 mg | Freq: Once | INTRAVENOUS | Status: AC
Start: 2016-06-30 — End: 2016-06-30
  Administered 2016-06-30: 4 mg via INTRAVENOUS
  Filled 2016-06-30: qty 1

## 2016-06-30 MED ORDER — MORPHINE SULFATE (PF) 4 MG/ML IV SOLN
4.0000 mg | Freq: Once | INTRAVENOUS | Status: AC
  Administered 2016-06-30: 4 mg via INTRAVENOUS
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
## 2016-06-30 NOTE — ED Notes (Signed)
Cleaned and applied sterile dressing. Applied long sugar tong splint per MD order

## 2016-06-30 NOTE — ED Notes (Addendum)
[facility]. Pt alert and oriented on discharge. Able to move toes to RLE when leaving.   VSS

## 2017-03-30 ENCOUNTER — Encounter (HOSPITAL_COMMUNITY): Payer: Self-pay | Admitting: Emergency Medicine

## 2017-03-30 ENCOUNTER — Emergency Department (HOSPITAL_COMMUNITY): Payer: BLUE CROSS/BLUE SHIELD

## 2017-03-30 ENCOUNTER — Other Ambulatory Visit: Payer: Self-pay

## 2017-03-30 ENCOUNTER — Emergency Department (HOSPITAL_COMMUNITY)
Admission: EM | Admit: 2017-03-30 | Discharge: 2017-03-30 | Disposition: A | Discharge status: Home / Self Care | Payer: BLUE CROSS/BLUE SHIELD | Attending: Emergency Medicine | Admitting: Emergency Medicine

## 2017-03-30 DIAGNOSIS — T07XXXA Unspecified multiple injuries, initial encounter: Secondary | ICD-10-CM

## 2017-03-30 DIAGNOSIS — Y939 Activity, unspecified: Secondary | ICD-10-CM | POA: Insufficient documentation

## 2017-03-30 DIAGNOSIS — S60512A Abrasion of left hand, initial encounter: Secondary | ICD-10-CM | POA: Diagnosis not present

## 2017-03-30 DIAGNOSIS — S161XXA Strain of muscle, fascia and tendon at neck level, initial encounter: Secondary | ICD-10-CM | POA: Insufficient documentation

## 2017-03-30 DIAGNOSIS — Y999 Unspecified external cause status: Secondary | ICD-10-CM | POA: Insufficient documentation

## 2017-03-30 DIAGNOSIS — S93402A Sprain of unspecified ligament of left ankle, initial encounter: Secondary | ICD-10-CM | POA: Diagnosis not present

## 2017-03-30 DIAGNOSIS — Z87891 Personal history of nicotine dependence: Secondary | ICD-10-CM | POA: Diagnosis not present

## 2017-03-30 DIAGNOSIS — Y929 Unspecified place or not applicable: Secondary | ICD-10-CM | POA: Insufficient documentation

## 2017-03-30 DIAGNOSIS — S20212A Contusion of left front wall of thorax, initial encounter: Secondary | ICD-10-CM | POA: Diagnosis not present

## 2017-03-30 DIAGNOSIS — S60511A Abrasion of right hand, initial encounter: Secondary | ICD-10-CM | POA: Insufficient documentation

## 2017-03-30 DIAGNOSIS — S0990XA Unspecified injury of head, initial encounter: Secondary | ICD-10-CM | POA: Diagnosis present
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
## 2018-05-08 IMAGING — DX DG ORBITS FOR FOREIGN BODY
2 series · 2 of 2 positions shown · non-contrast
Comparison: 01/08/2010

CLINICAL DATA: Metal working/exposure; does welding, has had 2
metallic foreign bodies previously removed from the RIGHT eye ;
clearance prior to MRI

EXAM:
ORBITS FOR FOREIGN BODY - 2 VIEW

[orbits pa (1 of 2)]
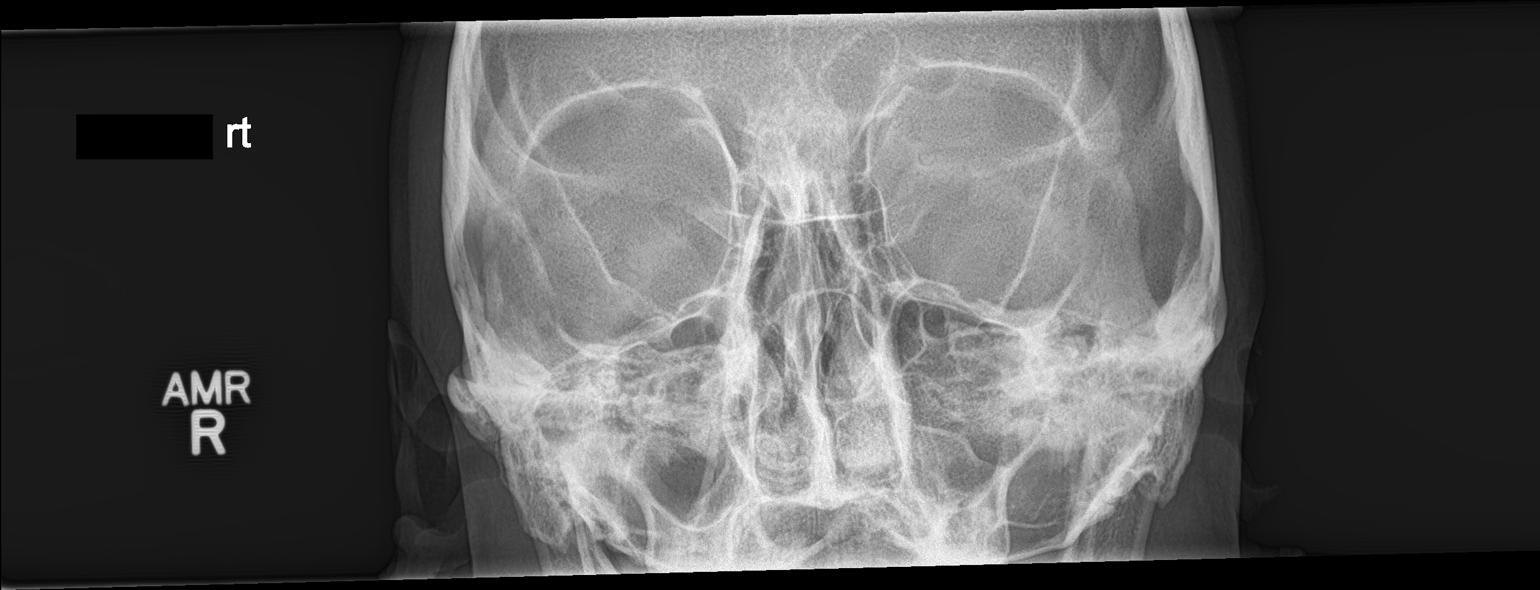

[orbits pa (2 of 2)]
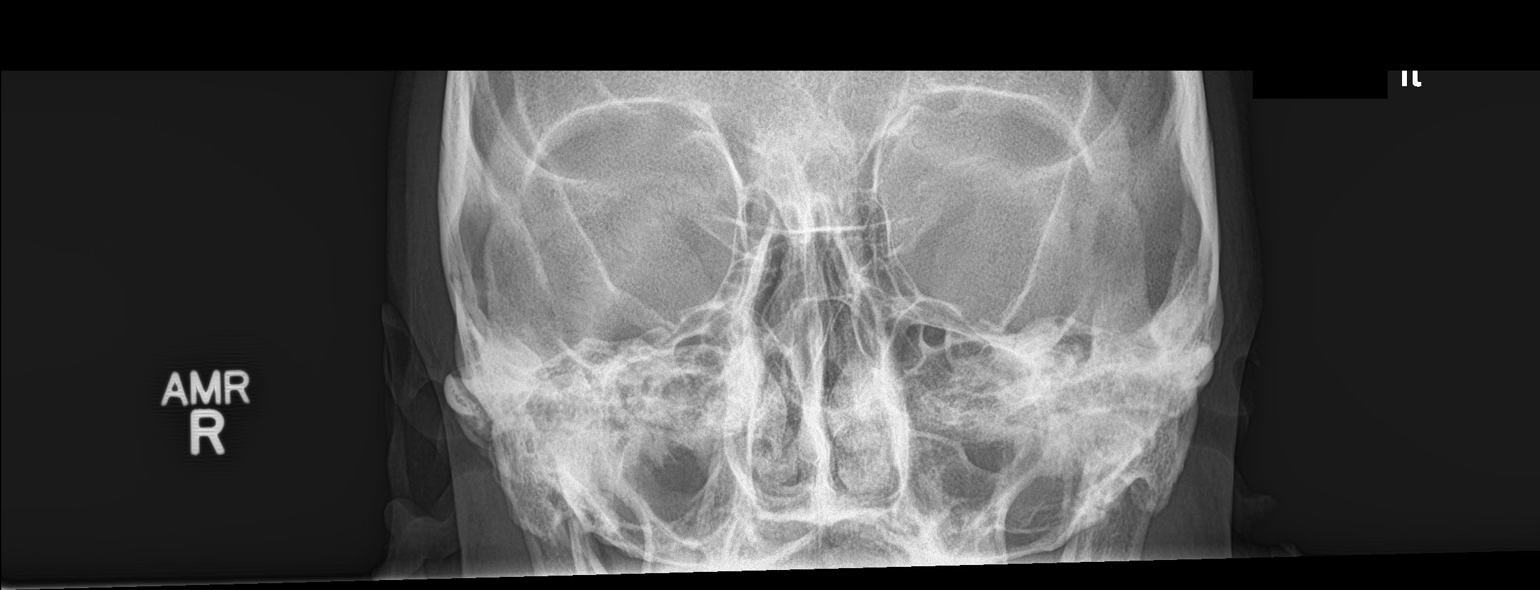

[2 of 2 positions shown; findings below may reference images not displayed]

FINDINGS: No orbital metallic foreign bodies identified.

Nasal septum midline.

Bones and sinuses unremarkable.
IMPRESSION: No orbital metallic foreign bodies identified.

## 2018-11-27 ENCOUNTER — Other Ambulatory Visit: Payer: Self-pay

## 2018-11-27 DIAGNOSIS — Z20822 Contact with and (suspected) exposure to covid-19: Secondary | ICD-10-CM

## 2018-11-28 LAB — NOVEL CORONAVIRUS, NAA: SARS-CoV-2, NAA: NOT DETECTED

## 2018-12-08 ENCOUNTER — Other Ambulatory Visit: Payer: Self-pay

## 2018-12-08 DIAGNOSIS — Z20822 Contact with and (suspected) exposure to covid-19: Secondary | ICD-10-CM

## 2018-12-10 LAB — NOVEL CORONAVIRUS, NAA: SARS-CoV-2, NAA: NOT DETECTED

## 2019-10-06 ENCOUNTER — Other Ambulatory Visit: Payer: Self-pay

## 2019-10-06 ENCOUNTER — Encounter (HOSPITAL_COMMUNITY): Payer: Self-pay | Admitting: *Deleted

## 2019-10-06 DIAGNOSIS — Z20822 Contact with and (suspected) exposure to covid-19: Secondary | ICD-10-CM | POA: Insufficient documentation

## 2019-10-06 DIAGNOSIS — R1032 Left lower quadrant pain: Secondary | ICD-10-CM | POA: Insufficient documentation

## 2019-10-06 DIAGNOSIS — R112 Nausea with vomiting, unspecified: Secondary | ICD-10-CM | POA: Insufficient documentation

## 2019-10-06 DIAGNOSIS — R252 Cramp and spasm: Secondary | ICD-10-CM | POA: Insufficient documentation

## 2019-10-06 DIAGNOSIS — Z87891 Personal history of nicotine dependence: Secondary | ICD-10-CM | POA: Insufficient documentation

## 2019-10-06 LAB — COMPREHENSIVE METABOLIC PANEL
ALT: 34 U/L (ref 0–44)
AST: 25 U/L (ref 15–41)
Albumin: 5 g/dL (ref 3.5–5.0)
Alkaline Phosphatase: 52 U/L (ref 38–126)
Anion gap: 13 (ref 5–15)
BUN: 15 mg/dL (ref 6–20)
CO2: 24 mmol/L (ref 22–32)
Calcium: 9.2 mg/dL (ref 8.9–10.3)
Chloride: 101 mmol/L (ref 98–111)
Creatinine, Ser: 1.08 mg/dL (ref 0.61–1.24)
GFR calc Af Amer: >60 mL/min (ref >60)
GFR calc non Af Amer: >60 mL/min (ref >60)
Glucose, Bld: 117 mg/dL — ABNORMAL HIGH (ref 70–99)
Potassium: 3.6 mmol/L (ref 3.5–5.1)
Sodium: 138 mmol/L (ref 135–145)
Total Bilirubin: 0.8 mg/dL (ref 0.3–1.2)
Total Protein: 8.3 g/dL — ABNORMAL HIGH (ref 6.5–8.1)

## 2019-10-06 LAB — CBC
HCT: 44.6 % (ref 39.0–52.0)
Hemoglobin: 15.3 g/dL (ref 13.0–17.0)
MCH: 29.4 pg (ref 26.0–34.0)
MCHC: 34.3 g/dL (ref 30.0–36.0)
MCV: 85.6 fL (ref 80.0–100.0)
Platelets: 353 10*3/uL (ref 150–400)
RBC: 5.21 MIL/uL (ref 4.22–5.81)
RDW: 11.8 % (ref 11.5–15.5)
WBC: 11.2 10*3/uL — ABNORMAL HIGH (ref 4.0–10.5)
nRBC: 0 % (ref 0.0–0.2)

## 2019-10-06 LAB — LIPASE, BLOOD: Lipase: 26 U/L (ref 11–51)

## 2019-10-06 NOTE — ED Triage Notes (Signed)
Pt c/o left side abdominal pain that is intermittent x one month; pt states the pain comes on suddenly and causes his body to tense up and "cramp up"

## 2019-10-07 ENCOUNTER — Emergency Department (HOSPITAL_COMMUNITY)
Admission: EM | Admit: 2019-10-07 | Discharge: 2019-10-07 | Disposition: A | Discharge status: Home / Self Care | Payer: Self-pay | Attending: Emergency Medicine | Admitting: Emergency Medicine

## 2019-10-07 ENCOUNTER — Emergency Department (HOSPITAL_COMMUNITY): Payer: Self-pay

## 2019-10-07 DIAGNOSIS — R1032 Left lower quadrant pain: Secondary | ICD-10-CM

## 2019-10-07 DIAGNOSIS — R7309 Other abnormal glucose: Secondary | ICD-10-CM

## 2019-10-07 DIAGNOSIS — Z20822 Contact with and (suspected) exposure to covid-19: Secondary | ICD-10-CM

## 2019-10-07 DIAGNOSIS — R030 Elevated blood-pressure reading, without diagnosis of hypertension: Secondary | ICD-10-CM

## 2019-10-07 LAB — URINALYSIS, ROUTINE W REFLEX MICROSCOPIC
Bacteria, UA: NONE SEEN
Bilirubin Urine: NEGATIVE
Glucose, UA: NEGATIVE mg/dL
Hgb urine dipstick: NEGATIVE
Ketones, ur: NEGATIVE mg/dL
Leukocytes,Ua: NEGATIVE
Nitrite: NEGATIVE
Protein, ur: 30 mg/dL — AB
Specific Gravity, Urine: 1.03 (ref 1.005–1.030)
pH: 5 (ref 5.0–8.0)

## 2019-10-07 LAB — SARS CORONAVIRUS 2 BY RT PCR (HOSPITAL ORDER, PERFORMED IN ~~LOC~~ HOSPITAL LAB): SARS Coronavirus 2: NEGATIVE

## 2019-10-07 MED ORDER — DICYCLOMINE HCL 20 MG PO TABS: 20.0000 mg | ORAL_TABLET | Freq: Three times a day (TID) | ORAL | 0 refills | Status: DC

## 2019-10-07 MED ORDER — DICYCLOMINE HCL 10 MG PO CAPS
10.0000 mg | ORAL_CAPSULE | Freq: Once | ORAL | Status: AC
  Administered 2019-10-07: 10 mg via ORAL
  Filled 2019-10-07: qty 1

## 2019-10-07 MED ORDER — IOHEXOL 300 MG/ML  SOLN
100.0000 mL | Freq: Once | INTRAMUSCULAR | Status: AC | PRN
  Administered 2019-10-07: 100 mL via INTRAVENOUS

## 2019-10-07 NOTE — Discharge Instructions (Addendum)
Your blood work and CT scan were normal.  There is no sign of anything serious causing your abdominal pain.  I suspect that you have irritable bowel syndrome.  Please follow-up with the gastroenterologist for further evaluation and treatment.  Your blood pressure was slightly elevated today.  Please have it checked several times over the next 2 weeks.  If it stays persistently elevated, you will need to be on medication to control it.  Poorly controlled high blood pressure can lead to heart attacks, strokes, kidney failure.  Your blood sugar was minimally elevated.  This will need to be followed as an outpatient.  Please check the results for your Covid test on MyChart.

## 2019-10-07 NOTE — ED Provider Notes (Signed)
[facility] EMERGENCY DEPARTMENT Provider Note   CSN: [id number] Arrival date & time: 10/06/19  2159   History Chief Complaint  Patient presents with   Abdominal Pain    Chad Taylor is a 29 y.o. male.  The history is provided by the patient.  Abdominal Pain He complains of crampy left lower quadrant pain which has been present for the last month.  Patient pain was significantly worse today.  It seems to be worse after eating and he has noted early satiety.  He has also noted a change in his bowel pattern.  Previously, he had been having 4-5 bowel movements a day and now is down to 1 bowel movement a day.  He denies fever, chills, sweats.  There has been some mild nausea and he has vomited occasionally.  Also, today he noted some generalized muscle cramps.  Pain is rated at 7/10.  He has not taken any medication to treat it.  History reviewed. No pertinent past medical history.  There are no problems to display for this patient.   History reviewed. No pertinent surgical history.     History reviewed. No pertinent family history.  Social History   Tobacco Use   Smoking status: Former Smoker   Smokeless tobacco: Never Used  Building services engineer Use: Never used  Substance Use Topics   Alcohol use: Yes    Alcohol/week: 24.0 standard drinks    Types: 24 Cans of beer per week    Comment: every other day   Drug use: No    Types: Marijuana    Comment: denies    Home Medications Prior to Admission medications   Medication Sig Start Date End Date Taking? Authorizing Provider  amoxicillin (AMOXIL) 500 MG capsule Take 1,000 mg by mouth once.    [provider]  ibuprofen (ADVIL,MOTRIN) 200 MG tablet Take 600 mg by mouth daily as needed for mild pain or moderate pain.    [provider]    Allergies    Patient has no known allergies.  Review of Systems   Review of Systems  Gastrointestinal: Positive for abdominal pain.  All other systems  reviewed and are negative.   Physical Exam Updated Vital Signs BP (!) 138/97 (BP Location: Right Arm)    Pulse 92    Temp 98.7 F (37.1 C) (Oral)    Resp 20    Ht 6' (1.829 m)    Wt 124.7 kg    SpO2 96%    BMI 37.30 kg/m   Physical Exam Vitals and nursing note reviewed.   Obese 29 year old male, resting comfortably and in no acute distress. Vital signs are significant for elevated blood pressure. Oxygen saturation is 96%, which is normal. Head is normocephalic and atraumatic. PERRLA, EOMI. Oropharynx is clear. Neck is nontender and supple without adenopathy or JVD. Back is nontender and there is no CVA tenderness. Lungs are clear without rales, wheezes, or rhonchi. Chest is nontender. Heart has regular rate and rhythm without murmur. Abdomen is soft, flat, with mild tenderness in the left lower quadrant.  There is no rebound or guarding.  There are no masses or hepatosplenomegaly and peristalsis is normoactive. Extremities have trace edema, full range of motion is present. Skin is warm and dry without Taylor. Neurologic: Mental status is normal, cranial nerves are intact, there are no motor or sensory deficits.  ED Results / Procedures / Treatments   Labs (all labs ordered are listed, but only abnormal  results are displayed) Labs Reviewed  COMPREHENSIVE METABOLIC PANEL - Abnormal; Notable for the following components:      Result Value   Glucose, Bld 117 (*)    Total Protein 8.3 (*)    All other components within normal limits  CBC - Abnormal; Notable for the following components:   WBC 11.2 (*)    All other components within normal limits  URINALYSIS, ROUTINE W REFLEX MICROSCOPIC - Abnormal; Notable for the following components:   Protein, ur 30 (*)    All other components within normal limits  SARS CORONAVIRUS 2 BY RT PCR (HOSPITAL ORDER, PERFORMED IN [facility])  LIPASE, BLOOD   Radiology CT ABDOMEN PELVIS W CONTRAST  Result Date: 10/07/2019 CLINICAL DATA:   Diverticulitis suspected. Intermittent abdominal pain. EXAM: CT ABDOMEN AND PELVIS WITH CONTRAST TECHNIQUE: Multidetector CT imaging of the abdomen and pelvis was performed using the standard protocol following bolus administration of intravenous contrast. CONTRAST:  OMNIPAQUE IOHEXOL 300 MG/ML  SOLN COMPARISON:  CT of the abdomen and pelvis 10/16/15 FINDINGS: Lower chest: The lung bases are clear without focal nodule, mass, or airspace disease. Heart size is normal. No significant pleural or pericardial effusion is present. Hepatobiliary: No focal liver abnormality is seen. No gallstones, gallbladder wall thickening, or biliary dilatation. Pancreas: Unremarkable. No pancreatic ductal dilatation or surrounding inflammatory changes. Spleen: Normal in size without focal abnormality. Adrenals/Urinary Tract: Adrenal glands are normal bilaterally. 12 mm cyst is present in the midportion of the left kidney. No other focal renal lesions are present. No stone is present. Ureters and urinary bladder are within normal limits. No obstruction is present. Stomach/Bowel: Stomach and duodenum are within normal limits. Small bowel is unremarkable. Terminal ileum is within normal limits. The appendix is visualized and normal. The ascending and transverse colon are normal. Descending and sigmoid colon are normal. No diverticular disease or focal inflammation is present. Vascular/Lymphatic: No significant vascular findings are present. No enlarged abdominal or pelvic lymph nodes. Reproductive: Prostate is unremarkable. Other: No abdominal wall hernia or abnormality. No abdominopelvic ascites. Musculoskeletal: No acute or significant osseous findings. IMPRESSION: 1. No acute or focal lesion to explain the patient's symptoms. 2. 12 mm cyst in the midportion of the left kidney. Electronically Signed   By: Marin Roberts M.D.   On: 10/07/2019 04:21    Procedures Procedures  Medications Ordered in ED Medications    dicyclomine (BENTYL) capsule 10 mg (has no administration in time range)  iohexol (OMNIPAQUE) 300 MG/ML solution 100 mL (100 mLs Intravenous Contrast Given 10/07/19 0356)    ED Course  I have reviewed the triage vital signs and the nursing notes.  Pertinent labs & imaging results that were available during my care of the patient were reviewed by me and considered in my medical decision making (see chart for details).  MDM Rules/Calculators/A&P Left lower quadrant pain concerning for diverticulitis.  Labs do show mild elevation of WBC.  He will be sent for CT of abdomen and pelvis.  Old records are reviewed, and he has no relevant past visits.  CT of abdomen and pelvis is unremarkable.  Symptoms probably secondary to irritable bowel syndrome.  Of note, patient also requests COVID-19 test because she had exposure to someone with COVID-19 about 1 week ago.  Test is sent.  He is referred to gastroenterology for further outpatient evaluation and treatment.  DELQUAN POUCHER was evaluated in Emergency Department on 10/07/2019 for the symptoms described in the history of present illness.  He was evaluated in the context of the global COVID-19 pandemic, which necessitated consideration that the patient might be at risk for infection with the SARS-CoV-2 virus that causes COVID-19. Institutional protocols and algorithms that pertain to the evaluation of patients at risk for COVID-19 are in a state of rapid change based on information released by regulatory bodies including the CDC and federal and state organizations. These policies and algorithms were followed during the patient's care in the ED.  Final Clinical Impression(s) / ED Diagnoses Final diagnoses:  None    Rx / DC Orders ED Discharge Orders         Ordered    dicyclomine (BENTYL) 20 MG tablet  3 times daily before meals & bedtime     Discontinue  Reprint     10/07/19 0433           Dione Booze, MD 10/07/19 [phone]

## 2020-01-29 ENCOUNTER — Other Ambulatory Visit: Payer: Self-pay

## 2020-01-29 ENCOUNTER — Emergency Department (HOSPITAL_COMMUNITY)
Admission: EM | Admit: 2020-01-29 | Discharge: 2020-01-29 | Disposition: A | Discharge status: Home / Self Care | Payer: BC Managed Care – PPO | Attending: Emergency Medicine | Admitting: Emergency Medicine

## 2020-01-29 ENCOUNTER — Encounter (HOSPITAL_COMMUNITY): Payer: Self-pay | Admitting: Emergency Medicine

## 2020-01-29 DIAGNOSIS — G510 Bell's palsy: Secondary | ICD-10-CM | POA: Insufficient documentation

## 2020-01-29 DIAGNOSIS — Z79899 Other long term (current) drug therapy: Secondary | ICD-10-CM | POA: Diagnosis not present

## 2020-01-29 DIAGNOSIS — Z87891 Personal history of nicotine dependence: Secondary | ICD-10-CM | POA: Insufficient documentation

## 2020-01-29 DIAGNOSIS — R29810 Facial weakness: Secondary | ICD-10-CM | POA: Diagnosis present

## 2020-01-29 MED ORDER — ACYCLOVIR 400 MG PO TABS: 800.0000 mg | ORAL_TABLET | Freq: Every day | ORAL | 0 refills | Status: DC

## 2020-01-29 MED ORDER — PREDNISONE 20 MG PO TABS: 40.0000 mg | ORAL_TABLET | Freq: Every day | ORAL | 0 refills | Status: AC

## 2020-01-29 NOTE — ED Notes (Signed)
ED Provider at bedside. 

## 2020-01-29 NOTE — ED Triage Notes (Signed)
Pt c/o of right sided facial numbness with right eye blurred vision, right eye drainage, unable to blink to right eye

## 2020-01-29 NOTE — ED Provider Notes (Signed)
[facility] EMERGENCY DEPARTMENT Provider Note   CSN: [id number] Arrival date & time: 01/29/20  6967     History Chief Complaint  Patient presents with  . Numbness    Chad Taylor is a 29 y.o. male.  HPI    Pt has had 6 days of sx, he is a 29 year old male, he is otherwise healthy, he started to have some facial droop with difficulty drinking, difficulty blinking on the right side with an eye that is excessively watering and noticed that his food was tasting differently.  This is been going on for 6 days, persistent, nothing makes it better or worse, not getting any better, no associated weakness or numbness of the arms or the legs, no difficulty speaking and no changes in vision.  No medications given prior to arrival, he does not have a family doctor, has no chronic medical problems.  History reviewed. No pertinent past medical history.  There are no problems to display for this patient.   History reviewed. No pertinent surgical history.     History reviewed. No pertinent family history.  Social History   Tobacco Use  . Smoking status: Former Games developer  . Smokeless tobacco: Never Used  Vaping Use  . Vaping Use: Never used  Substance Use Topics  . Alcohol use: Yes    Alcohol/week: 24.0 standard drinks    Types: 24 Cans of beer per week    Comment: every other day  . Drug use: No    Types: Marijuana    Comment: denies    Home Medications Prior to Admission medications   Medication Sig Start Date End Date Taking? Authorizing Provider  acyclovir (ZOVIRAX) 400 MG tablet Take 2 tablets (800 mg total) by mouth 5 (five) times daily. 01/29/20   Eber Hong, MD  dicyclomine (BENTYL) 20 MG tablet Take 1 tablet (20 mg total) by mouth 4 (four) times daily -  before meals and at bedtime. 10/07/19   Dione Booze, MD  ibuprofen (ADVIL,MOTRIN) 200 MG tablet Take 600 mg by mouth daily as needed for mild pain or moderate pain.    [provider]  predniSONE (DELTASONE)  20 MG tablet Take 2 tablets (40 mg total) by mouth daily for 7 days. 01/29/20 02/05/20  Eber Hong, MD    Allergies    Patient has no known allergies.  Review of Systems   Review of Systems  All other systems reviewed and are negative.   Physical Exam Updated Vital Signs BP (!) 151/75 (BP Location: Right Arm)   Pulse 82   Temp 98.3 F (36.8 C) (Oral)   Resp 18   Ht 1.829 m (6')   Wt 124.7 kg   SpO2 99%   BMI 37.30 kg/m   Physical Exam Vitals and nursing note reviewed.  Constitutional:      General: He is not in acute distress.    Appearance: He is well-developed.  HENT:     Head: Normocephalic and atraumatic.     Mouth/Throat:     Pharynx: No oropharyngeal exudate.  Eyes:     General: No scleral icterus.       Right eye: No discharge.        Left eye: No discharge.     Conjunctiva/sclera: Conjunctivae normal.     Pupils: Pupils are equal, round, and reactive to light.  Neck:     Thyroid: No thyromegaly.     Vascular: No JVD.  Cardiovascular:     Rate and Rhythm: Normal rate  and regular rhythm.     Heart sounds: Normal heart sounds. No murmur heard.  No friction rub. No gallop.   Pulmonary:     Effort: Pulmonary effort is normal. No respiratory distress.     Breath sounds: Normal breath sounds. No wheezing or rales.  Abdominal:     General: Bowel sounds are normal. There is no distension.     Palpations: Abdomen is soft. There is no mass.     Tenderness: There is no abdominal tenderness.  Musculoskeletal:        General: No tenderness. Normal range of motion.     Cervical back: Normal range of motion and neck supple.  Lymphadenopathy:     Cervical: No cervical adenopathy.  Skin:    General: Skin is warm and dry.     Findings: No erythema or rash.  Neurological:     Mental Status: He is alert.     Comments: Neurologic exam:  Speech clear, pupils equal round reactive to light, extraocular movements intact  Normal peripheral visual fields Cranial  nerves III through XII normal but there is facial droop and inability to raise the R forehead or close the R eye lid. Follows commands, moves all extremities x4, normal strength to bilateral upper and lower extremities at all major muscle groups including grip Sensation normal to light touch and pinprick Coordination intact, no limb ataxia, finger-nose-finger normal, heel shin normal bilaterally Rapid alternating movements normal No pronator drift Gait normal Can heal and toe walk without weakness.   Psychiatric:        Behavior: Behavior normal.     ED Results / Procedures / Treatments   Labs (all labs ordered are listed, but only abnormal results are displayed) Labs Reviewed - No data to display  EKG None  Radiology No results found.  Procedures Procedures (including critical care time)  Medications Ordered in ED Medications - No data to display  ED Course  I have reviewed the triage vital signs and the nursing notes.  Pertinent labs & imaging results that were available during my care of the patient were reviewed by me and considered in my medical decision making (see chart for details).    MDM Rules/Calculators/A&P                          This patient has a classic Bell palsy, he is minimally hypertensive at 150/75, afebrile has a normal pulse and classic Bell palsy findings.  He will be prescribed prednisone and acyclovir, the patient is stable for discharge, follow-up list given, the patient is agreeable  Final Clinical Impression(s) / ED Diagnoses Final diagnoses:  Bell palsy    Rx / DC Orders ED Discharge Orders         Ordered    predniSONE (DELTASONE) 20 MG tablet  Daily        01/29/20 0715    acyclovir (ZOVIRAX) 400 MG tablet  5 times daily        01/29/20 0715           Eber Hong, MD 01/29/20 [phone]

## 2020-01-29 NOTE — Discharge Instructions (Signed)
Take prednisone and acyclovir as prescribed  Read attached instructions  [facility] Doctor List    Kari Baars MD. Specialty: Pulmonary Disease Contact information: [address]  [address]  [place] [address]  [phone]   Syliva Overman, MD. Specialty: [facility] Medicine Contact information: [address]  [place] [address]  [phone]   Lilyan Punt, MD. Specialty: [facility] Medicine Contact information: [address]  [address]  [place] [address]  [phone]   Avon Gully, MD Specialty: Internal Medicine Contact information: [address] [place] [address]  [phone]   Catalina Pizza, MD. Specialty: Internal Medicine Contact information: [address]  [place] [address]  [phone]    [facility] (Dr. Selena Batten) Specialty: Family Medicine Contact information: [address]  [place] [address]  [phone]   John Giovanni, MD. Specialty: [facility] Medicine Contact information: [address]  [address]  [place] [address]  [phone]   Carylon Perches, MD. Specialty: Internal Medicine Contact information: [address]  [address]  [place] [address]  [phone]    [facility]  [address] [place], [place] [address] [phone]  Services The [facility] offers a variety of basic health services.  Services include but are not limited to: Blood pressure checks  Heart rate checks  Blood sugar checks  Urine analysis  Rapid strep tests  Pregnancy tests.  Health education and referrals  People needing more complex services will be directed to a physician online. Using these virtual visits, doctors can evaluate and prescribe medicine and treatments. There will be no medication on-site, though [facility] will help patients fill their prescriptions at little to no cost.   For More  information please go to: [email or web address]

## 2022-02-21 ENCOUNTER — Emergency Department (HOSPITAL_COMMUNITY): Payer: Self-pay

## 2022-02-21 ENCOUNTER — Other Ambulatory Visit: Payer: Self-pay

## 2022-02-21 ENCOUNTER — Emergency Department (HOSPITAL_COMMUNITY)
Admission: EM | Admit: 2022-02-21 | Discharge: 2022-02-22 | Disposition: A | Discharge status: Home / Self Care | Payer: Self-pay | Attending: Emergency Medicine | Admitting: Emergency Medicine

## 2022-02-21 DIAGNOSIS — R0602 Shortness of breath: Secondary | ICD-10-CM | POA: Insufficient documentation

## 2022-02-21 DIAGNOSIS — M545 Low back pain, unspecified: Secondary | ICD-10-CM | POA: Insufficient documentation

## 2022-02-21 DIAGNOSIS — E860 Dehydration: Secondary | ICD-10-CM | POA: Insufficient documentation

## 2022-02-21 DIAGNOSIS — R059 Cough, unspecified: Secondary | ICD-10-CM | POA: Insufficient documentation

## 2022-02-21 DIAGNOSIS — R61 Generalized hyperhidrosis: Secondary | ICD-10-CM | POA: Insufficient documentation

## 2022-02-21 DIAGNOSIS — Z1152 Encounter for screening for COVID-19: Secondary | ICD-10-CM | POA: Insufficient documentation

## 2022-02-21 DIAGNOSIS — S2231XA Fracture of one rib, right side, initial encounter for closed fracture: Secondary | ICD-10-CM | POA: Insufficient documentation

## 2022-02-21 DIAGNOSIS — D72829 Elevated white blood cell count, unspecified: Secondary | ICD-10-CM | POA: Insufficient documentation

## 2022-02-21 DIAGNOSIS — R091 Pleurisy: Secondary | ICD-10-CM | POA: Insufficient documentation

## 2022-02-21 DIAGNOSIS — W010XXA Fall on same level from slipping, tripping and stumbling without subsequent striking against object, initial encounter: Secondary | ICD-10-CM | POA: Insufficient documentation

## 2022-02-21 LAB — COMPREHENSIVE METABOLIC PANEL
ALT: 29 U/L (ref 0–44)
AST: 23 U/L (ref 15–41)
Albumin: 4.5 g/dL (ref 3.5–5.0)
Alkaline Phosphatase: 57 U/L (ref 38–126)
Anion gap: 8 (ref 5–15)
BUN: 16 mg/dL (ref 6–20)
CO2: 25 mmol/L (ref 22–32)
Calcium: 9.2 mg/dL (ref 8.9–10.3)
Chloride: 106 mmol/L (ref 98–111)
Creatinine, Ser: 1.5 mg/dL — ABNORMAL HIGH (ref 0.61–1.24)
GFR, Estimated: >60 mL/min (ref >60)
Glucose, Bld: 100 mg/dL — ABNORMAL HIGH (ref 70–99)
Potassium: 4.3 mmol/L (ref 3.5–5.1)
Sodium: 139 mmol/L (ref 135–145)
Total Bilirubin: 0.4 mg/dL (ref 0.3–1.2)
Total Protein: 7.5 g/dL (ref 6.5–8.1)

## 2022-02-21 LAB — CBC
HCT: 42.9 % (ref 39.0–52.0)
Hemoglobin: 14.7 g/dL (ref 13.0–17.0)
MCH: 28.7 pg (ref 26.0–34.0)
MCHC: 34.3 g/dL (ref 30.0–36.0)
MCV: 83.6 fL (ref 80.0–100.0)
Platelets: 339 10*3/uL (ref 150–400)
RBC: 5.13 MIL/uL (ref 4.22–5.81)
RDW: 12.1 % (ref 11.5–15.5)
WBC: 11.6 10*3/uL — ABNORMAL HIGH (ref 4.0–10.5)
nRBC: 0 % (ref 0.0–0.2)

## 2022-02-21 LAB — RESP PANEL BY RT-PCR (RSV, FLU A&B, COVID)  RVPGX2
Influenza A by PCR: NEGATIVE
Influenza B by PCR: NEGATIVE
Resp Syncytial Virus by PCR: NEGATIVE
SARS Coronavirus 2 by RT PCR: NEGATIVE

## 2022-02-21 MED ORDER — FENTANYL CITRATE (PF) 100 MCG/2ML IJ SOLN
100.0000 ug | Freq: Once | INTRAMUSCULAR | Status: AC
  Administered 2022-02-21: 100 ug via INTRAVENOUS
  Filled 2022-02-21: qty 2

## 2022-02-21 MED ORDER — SODIUM CHLORIDE 0.9 % IV BOLUS
1000.0000 mL | Freq: Once | INTRAVENOUS | Status: AC
  Administered 2022-02-21: 1000 mL via INTRAVENOUS

## 2022-02-21 NOTE — ED Triage Notes (Signed)
Pt presents to ED from home C/O R sided thoracic pain X 3 weeks. Pt states he has had a cough X 3 weeks, today coughed and fell to ground, heard loud popped. Endorses SOB and intense R side pain since. Pt diaphoretic in triage.

## 2022-02-22 ENCOUNTER — Emergency Department (HOSPITAL_COMMUNITY): Payer: Self-pay

## 2022-02-22 ENCOUNTER — Encounter (HOSPITAL_COMMUNITY): Payer: Self-pay | Admitting: Radiology

## 2022-02-22 MED ORDER — IOHEXOL 350 MG/ML SOLN
75.0000 mL | Freq: Once | INTRAVENOUS | Status: AC | PRN
  Administered 2022-02-22: 75 mL via INTRAVENOUS

## 2022-02-22 MED ORDER — BENZONATATE 100 MG PO CAPS: 100.0000 mg | ORAL_CAPSULE | Freq: Three times a day (TID) | ORAL | 0 refills | Status: AC | PRN

## 2022-02-22 MED ORDER — BENZONATATE 100 MG PO CAPS
100.0000 mg | ORAL_CAPSULE | Freq: Once | ORAL | Status: AC
  Administered 2022-02-22: 100 mg via ORAL
  Filled 2022-02-22: qty 1

## 2022-02-22 MED ORDER — HYDROCOD POLI-CHLORPHE POLI ER 10-8 MG/5ML PO SUER: 5.0000 mL | Freq: Every evening | ORAL | 0 refills | Status: AC | PRN

## 2022-02-22 MED ORDER — ALBUTEROL SULFATE HFA 108 (90 BASE) MCG/ACT IN AERS
2.0000 | INHALATION_SPRAY | Freq: Once | RESPIRATORY_TRACT | Status: AC
  Administered 2022-02-22: 2 via RESPIRATORY_TRACT
  Filled 2022-02-22: qty 6.7

## 2022-02-22 MED ORDER — KETOROLAC TROMETHAMINE 15 MG/ML IJ SOLN
15.0000 mg | Freq: Once | INTRAMUSCULAR | Status: AC
  Administered 2022-02-22: 15 mg via INTRAVENOUS
  Filled 2022-02-22: qty 1

## 2022-02-22 NOTE — ED Provider Notes (Signed)
[facility] EMERGENCY DEPARTMENT Provider Note   CSN: [id number] Arrival date & time: 02/21/22  1642     History  Chief Complaint  Patient presents with   Back Pain    Chad Taylor is a 31 y.o. male.  The history is provided by the patient.  Patient with previous history of Bell's palsy presents with right-sided back pain.  Patient reports he has had right-sided back pain for several weeks.  He also had cough for about 3 weeks.  He reports he had a coughing fit earlier in the day which caused severe pain in his back and he fell to the ground.  He reports he heard a pop in his back.  On arrival he was short of breath and diaphoretic.  Denies any head injury.  He started taking an old amoxicillin recently for his cough. No traumatic injuries reported.  No focal weakness.  No previous history of VTE    Home Medications Prior to Admission medications   Medication Sig Start Date End Date Taking? Authorizing Provider  acyclovir (ZOVIRAX) 400 MG tablet Take 2 tablets (800 mg total) by mouth 5 (five) times daily. 01/29/20   Eber Hong, MD  dicyclomine (BENTYL) 20 MG tablet Take 1 tablet (20 mg total) by mouth 4 (four) times daily -  before meals and at bedtime. 10/07/19   Dione Booze, MD  ibuprofen (ADVIL,MOTRIN) 200 MG tablet Take 600 mg by mouth daily as needed for mild pain or moderate pain.    [provider]      Allergies    Patient has no known allergies.    Review of Systems   Review of Systems  Constitutional:  Negative for fever.  Respiratory:  Positive for cough and shortness of breath.   Musculoskeletal:  Positive for back pain.    Physical Exam Updated Vital Signs BP 131/80 (BP Location: Left Arm)   Pulse 92   Temp 98.5 F (36.9 C) (Oral)   Resp 19   Ht 1.829 m (6')   Wt (!) 138.3 kg   SpO2 94%   BMI 41.37 kg/m  Physical Exam CONSTITUTIONAL: Well developed/well nourished, anxious HEAD: Normocephalic/atraumatic EYES: EOMI/PERRL ENMT: Mucous  membranes moist NECK: supple no meningeal signs SPINE/BACK:entire spine nontender CV: S1/S2 noted, no murmurs/rubs/gallops noted LUNGS: Lungs are clear to auscultation bilaterally, no apparent distress Diffuse posterior right-sided chest wall tenderness, no crepitus, no erythema or bruising ABDOMEN: soft, nontender NEURO: Pt is awake/alert/appropriate, moves all extremitiesx4.  No facial droop.   EXTREMITIES: pulses normal/equal, full ROM SKIN: warm, color normal PSYCH: Anxious ED Results / Procedures / Treatments   Labs (all labs ordered are listed, but only abnormal results are displayed) Labs Reviewed  COMPREHENSIVE METABOLIC PANEL - Abnormal; Notable for the following components:      Result Value   Glucose, Bld 100 (*)    Creatinine, Ser 1.50 (*)    All other components within normal limits  CBC - Abnormal; Notable for the following components:   WBC 11.6 (*)    All other components within normal limits  RESP PANEL BY RT-PCR (RSV, FLU A&B, COVID)  [id number]    EKG ED ECG REPORT   Date: 02/21/2022 2228  Rate: 93  Rhythm: normal sinus rhythm  QRS Axis: normal  Intervals: normal  ST/T Wave abnormalities: early repolarization  Conduction Disutrbances:none  Narrative Interpretation:   Old EKG Reviewed: unchanged  I have personally reviewed the EKG tracing and agree with the computerized printout as noted.  Radiology CT Angio Chest PE W and/or Wo Contrast  Result Date: 02/22/2022 CLINICAL DATA:  Right-sided back and chest pain with shortness of breath and cough for 3 weeks EXAM: CT ANGIOGRAPHY CHEST WITH CONTRAST TECHNIQUE: Multidetector CT imaging of the chest was performed using the standard protocol during bolus administration of intravenous contrast. Multiplanar CT image reconstructions and MIPs were obtained to evaluate the vascular anatomy. RADIATION DOSE REDUCTION: This exam was performed according to the departmental dose-optimization program which includes  automated exposure control, adjustment of the mA and/or kV according to patient size and/or use of iterative reconstruction technique. CONTRAST:  57mL OMNIPAQUE IOHEXOL 350 MG/ML SOLN COMPARISON:  Radiographs 02/13/2022 and CT chest 10/16/2015 FINDINGS: Cardiovascular: Satisfactory opacification of the pulmonary arteries to the segmental level. No evidence of pulmonary embolism. Normal heart size. No pericardial effusion. Mediastinum/Nodes: No enlarged mediastinal, hilar, or axillary lymph nodes. Thyroid gland, trachea, and esophagus demonstrate no significant findings. Lungs/Pleura: Lungs are clear. No pleural effusion or pneumothorax. Air trapping in the lower lungs compatible with small airway infection/inflammation. Mild diffuse bronchial wall thickening. Upper Abdomen: No acute abnormality. Musculoskeletal: Acute mildly displaced fracture of the posterior right eighth rib Review of the MIP images confirms the above findings. IMPRESSION: Negative for acute pulmonary embolism. Acute mildly displaced fracture of the posterior right 8th rib. Mild small airway infection/inflammation. Electronically Signed   By: Minerva Fester M.D.   On: 02/22/2022 00:36   DG Chest 2 View  Result Date: 02/21/2022 CLINICAL DATA:  Chest pain. EXAM: CHEST - 2 VIEW COMPARISON:  March 30, 2017. FINDINGS: The heart size and mediastinal contours are within normal limits. Both lungs are clear. The visualized skeletal structures are unremarkable. IMPRESSION: No active cardiopulmonary disease. Electronically Signed   By: Lupita Raider M.D.   On: 02/21/2022 17:52    Procedures Procedures    Medications Ordered in ED Medications  fentaNYL (SUBLIMAZE) injection 100 mcg (100 mcg Intravenous Given 02/21/22 2344)  sodium chloride 0.9 % bolus 1,000 mL (0 mLs Intravenous Stopped 02/22/22 0110)  iohexol (OMNIPAQUE) 350 MG/ML injection 75 mL (75 mLs Intravenous Contrast Given 02/22/22 0021)  ketorolac (TORADOL) 15 MG/ML injection 15  mg (15 mg Intravenous Given 02/22/22 0109)  benzonatate (TESSALON) capsule 100 mg (100 mg Oral Given 02/22/22 0137)  albuterol (VENTOLIN HFA) 108 (90 Base) MCG/ACT inhaler 2 puff (2 puffs Inhalation Given 02/22/22 0219)    ED Course/ Medical Decision Making/ A&P Clinical Course as of 02/22/22 0251  Fri Feb 22, 2022  0031 Creatinine(!): 1.50 Renal insufficiency [DW]  0032 WBC(!): 11.6 Leukocytosis [DW]  0250 Patient found to have isolated rib fracture from recent coughing.  No signs of PE.  No other acute findings.  Patient is now resting comfortably.  Pulse ox above 95%.  He is safe for discharge home.  Will give short course of Tessalon Perles at his request, and Tussionex for cough.  Advise short course of ibuprofen but to increase oral fluids.  I counseled him on stopping smoking marijuana [DW]    Clinical Course User Index [DW] Zadie Rhine, MD                           Medical Decision Making Amount and/or Complexity of Data Reviewed Labs: ordered. Decision-making details documented in ED Course. Radiology: ordered.  Risk Prescription drug management.   This patient presents to the ED for concern of chest and back pain, this involves an extensive number of treatment options,  and is a complaint that carries with it a high risk of complications and morbidity.  The differential diagnosis includes but is not limited to acute coronary syndrome, aortic dissection, pulmonary embolism, pericarditis, pneumothorax, pneumonia, myocarditis, pleurisy, esophageal rupture, rib fracture   Social Determinants of Health: Patient's impaired access to primary care  increases the complexity of managing their presentation and no insurance  Additional history obtained: Records reviewed  outpatient records reviewed  Lab Tests: I Ordered, and personally interpreted labs.  The pertinent results include: Renal insufficiency  Imaging Studies ordered: I ordered imaging studies including CT scan  chest   I independently visualized and interpreted imaging which showed rib fracture I agree with the radiologist interpretation  Cardiac Monitoring: The patient was maintained on a cardiac monitor.  I personally viewed and interpreted the cardiac monitor which showed an underlying rhythm of:  sinus rhythm  Medicines ordered and prescription drug management: I ordered medication including Toradol and fentanyl for pain Reevaluation of the patient after these medicines showed that the patient    improved  Reevaluation: After the interventions noted above, I reevaluated the patient and found that they have :improved  Complexity of problems addressed: Patient's presentation is most consistent with  acute presentation with potential threat to life or bodily function  Disposition: After consideration of the diagnostic results and the patient's response to treatment,  I feel that the patent would benefit from discharge   .           Final Clinical Impression(s) / ED Diagnoses Final diagnoses:  Closed fracture of one rib of right side, initial encounter  Pleurisy  Dehydration    Rx / DC Orders ED Discharge Orders          Ordered    benzonatate (TESSALON) 100 MG capsule  3 times daily PRN        02/22/22 0249    chlorpheniramine-HYDROcodone (TUSSIONEX) 10-8 MG/5ML  At bedtime PRN        02/22/22 0249              Zadie Rhine, MD 02/22/22 [phone]

## 2023-08-25 ENCOUNTER — Emergency Department (HOSPITAL_BASED_OUTPATIENT_CLINIC_OR_DEPARTMENT_OTHER): Payer: Self-pay | Admitting: Radiology

## 2023-08-25 ENCOUNTER — Other Ambulatory Visit: Payer: Self-pay

## 2023-08-25 ENCOUNTER — Encounter (HOSPITAL_BASED_OUTPATIENT_CLINIC_OR_DEPARTMENT_OTHER): Payer: Self-pay | Admitting: Emergency Medicine

## 2023-08-25 ENCOUNTER — Emergency Department (HOSPITAL_BASED_OUTPATIENT_CLINIC_OR_DEPARTMENT_OTHER)
Admission: EM | Admit: 2023-08-25 | Discharge: 2023-08-25 | Disposition: A | Discharge status: Home / Self Care | Payer: Worker's Compensation | Attending: Emergency Medicine | Admitting: Emergency Medicine

## 2023-08-25 DIAGNOSIS — M79671 Pain in right foot: Secondary | ICD-10-CM | POA: Insufficient documentation

## 2023-08-25 DIAGNOSIS — W228XXA Striking against or struck by other objects, initial encounter: Secondary | ICD-10-CM | POA: Diagnosis not present

## 2023-08-25 DIAGNOSIS — Z87891 Personal history of nicotine dependence: Secondary | ICD-10-CM | POA: Insufficient documentation

## 2023-08-25 MED ORDER — HYDROCODONE-ACETAMINOPHEN 5-325 MG PO TABS
1.0000 | ORAL_TABLET | Freq: Once | ORAL | Status: AC
  Administered 2023-08-25: 1 via ORAL
  Filled 2023-08-25: qty 1

## 2023-08-25 MED ORDER — CELECOXIB 200 MG PO CAPS: 200.0000 mg | ORAL_CAPSULE | Freq: Two times a day (BID) | ORAL | 0 refills | Status: AC | PRN

## 2023-08-25 NOTE — ED Triage Notes (Signed)
 C/o R foot/heel pain after being ran over by forklift this morning. Denies LOC. States was only ran over foot

## 2023-08-25 NOTE — Discharge Instructions (Signed)
 As discussed, your x-ray does not appear to have any fracture or dislocation.  Your Achilles tendon appears to be intact on exam.  Will place you in a cam walker boot as well as give you crutches to help aid in walking.  Recommend taking anti-inflammatories for pain; will send in prescription of Celebrex.  Continue to ice your foot/ankle to help with swelling.  Elevate your leg above the level of your heart to help with swelling..  Will attach information for a orthopedic specialist to follow-up with if you continue to have significant pain once the swelling has resolved.  Please do not hesitate to return to emergency department if the worrisome signs and symptoms we discussed become apparent.

## 2023-08-25 NOTE — ED Provider Notes (Signed)
 [place] EMERGENCY DEPARTMENT AT [facility] Provider Note   CSN: [id number] Arrival date & time: 08/25/23  9158     Patient presents with: Foot Injury   Chad Taylor is a 33 y.o. male.    Foot Injury   33 year old male presents emergency department with complaints of right heel pain.  States that he was at a warehouse when a forklift wheel hit him on the back of his right foot but never rolled over his foot/ankle.  States since then has had pain in his right heel.  Has been able to move his ankle but states it is painful to do so.  Denies any pain/injury elsewhere.  Denies any trauma to head, LOC.  Took an Aleve  earlier which she states has not been helping very much with his symptoms.  Presents emergency department for further assessment/evaluation.  No significant pertinent past medical history.  Prior to Admission medications   Medication Sig Start Date End Date Taking? Authorizing Provider  benzonatate  (TESSALON ) 100 MG capsule Take 1 capsule (100 mg total) by mouth 3 (three) times daily as needed for cough. 02/22/22   Midge Golas, MD  chlorpheniramine-HYDROcodone  (TUSSIONEX) 10-8 MG/5ML Take 5 mLs by mouth at bedtime as needed for cough. 02/22/22   Midge Golas, MD    Allergies: Patient has no known allergies.    Review of Systems  All other systems reviewed and are negative.   Updated Vital Signs There were no vitals taken for this visit.  Physical Exam Vitals and nursing note reviewed.  Constitutional:      General: He is not in acute distress.    Appearance: He is well-developed.  HENT:     Head: Normocephalic and atraumatic.   Eyes:     Conjunctiva/sclera: Conjunctivae normal.    Cardiovascular:     Rate and Rhythm: Normal rate and regular rhythm.     Heart sounds: No murmur heard. Pulmonary:     Effort: Pulmonary effort is normal. No respiratory distress.     Breath sounds: Normal breath sounds.  Abdominal:     Palpations:  Abdomen is soft.     Tenderness: There is no abdominal tenderness.   Musculoskeletal:        General: No swelling.     Cervical back: Neck supple.     Comments: Patient will range right ankle digits of right foot fully but with pain most so in dorsiflexion.  Achilles tendon seems to be intact with plantarflexion.  Swelling as well as ecchymosis appreciated posterior lateral and medial aspect of right ankle as well as calcaneus.  Tender palpation posterior aspect of medial malleolus as well as lateral malleolus.  Pedal and posttibial tibial pulses 2+ bilaterally.  No other tenderness reproducible bilateral lower extremities.   Skin:    General: Skin is warm and dry.     Capillary Refill: Capillary refill takes less than 2 seconds.   Neurological:     Mental Status: He is alert.   Psychiatric:        Mood and Affect: Mood normal.     (all labs ordered are listed, but only abnormal results are displayed) Labs Reviewed - No data to display  EKG: None  Radiology: No results found.   Procedures   Medications Ordered in the ED - No data to display  Medical Decision Making Amount and/or Complexity of Data Reviewed Radiology: ordered.  Risk Prescription drug management.   This patient presents to the ED for concern of foot pain, this involves an extensive number of treatment options, and is a complaint that carries with it a high risk of complications and morbidity.  The differential diagnosis includes fracture, strain/pain, dislocation, ligament/tendon injury, neurovascular compromise, other   Co morbidities that complicate the patient evaluation  See HPI   Additional history obtained:  Additional history obtained from EMR External records from outside source obtained and reviewed including hospital records   Lab Tests:  N/a   Imaging Studies ordered:  I ordered imaging studies including x-ray right foot I independently  visualized and interpreted imaging which showed no acute fracture or dislocation I agree with the radiologist interpretation   Cardiac Monitoring: / EKG:  N/a   Consultations Obtained:  N/a   Problem List / ED Course / Critical interventions / Medication management  Right foot pain I ordered medication including Norco Reevaluation of the patient after these medicines showed that the patient improved I have reviewed the patients home medicines and have made adjustments as needed   Social Determinants of Health:  Former cigarette use.  Denies illicit drug use.   Test / Admission - Considered:  Right foot pain Vitals signs significant for hypertension blood pressure 140/75. Otherwise within normal range and stable throughout visit. Imaging studies significant for: See above 33 year old male presents emergency department with complaints of right heel pain.  States that he was at a warehouse when a forklift wheel hit him on the back of his right foot but never rolled over his foot/ankle.  States since then has had pain in his right heel.  Has been able to move his ankle but states it is painful to do so.  Denies any pain/injury elsewhere.  Denies any trauma to head, LOC.  Took an Aleve  earlier which she states has not been helping very much with his symptoms.  Presents emergency department for further assessment/evaluation. On exam, swelling and some ecchymosis appreciated posterior aspect of right ankle with tenderness posterior medial/lateral malleolus as well as calcaneus.  Patient able to range right ankle fully with intact appearing Achilles tendon.  No pulse deficits to suggest ischemic limb.  No overlying skin changes concerning for secondary infectious process.  X-ray obtained which did not show any obvious fracture or dislocation.  Will place patient in cam walker boot, give crutches to help aid in ambulation and recommend RICE until orthopedic follow-up in the outpatient  setting.  Treatment plan discussed with patient and he was understanding was agreeable to said plan.  Patient well-appearing, afebrile in no acute distress. Worrisome signs and symptoms were discussed with the patient, and the patient acknowledged understanding to return to the ED if noticed. Patient was stable upon discharge.       Final diagnoses:  None    ED Discharge Orders     None          Silver Wonda LABOR, [place] 08/25/23 9063    Levander Houston, MD 08/26/23 1130
